# Patient Record
Sex: Female | Born: 1965 | Race: Black or African American | Hispanic: No | Marital: Single
Health system: Southern US, Community
[De-identification: ages and names within clinical notes are randomized; demographics above are authoritative.]

---

## 2013-03-24 ENCOUNTER — Emergency Department: Payer: Self-pay | Admitting: Emergency Medicine

## 2013-03-24 LAB — URINALYSIS, COMPLETE
Blood: NEGATIVE
Leukocyte Esterase: NEGATIVE
Ph: 7 (ref 4.5–8.0)
RBC,UR: 2 /HPF (ref 0–5)
Specific Gravity: 1.015 (ref 1.003–1.030)
Squamous Epithelial: 3
WBC UR: 5 /HPF (ref 0–5)

## 2013-03-24 LAB — CBC
HCT: 41.4 % (ref 35.0–47.0)
HGB: 14.1 g/dL (ref 12.0–16.0)
MCH: 27.7 pg (ref 26.0–34.0)
MCHC: 34.1 g/dL (ref 32.0–36.0)
MCV: 81 fL (ref 80–100)
Platelet: 308 10*3/uL (ref 150–440)
RBC: 5.11 10*6/uL (ref 3.80–5.20)
RDW: 18.5 % — ABNORMAL HIGH (ref 11.5–14.5)
WBC: 11.5 10*3/uL — ABNORMAL HIGH (ref 3.6–11.0)

## 2013-03-24 LAB — COMPREHENSIVE METABOLIC PANEL
Albumin: 4.2 g/dL (ref 3.4–5.0)
Anion Gap: 8 (ref 7–16)
BUN: 8 mg/dL (ref 7–18)
Calcium, Total: 9.7 mg/dL (ref 8.5–10.1)
Chloride: 99 mmol/L (ref 98–107)
Creatinine: 0.77 mg/dL (ref 0.60–1.30)
EGFR (African American): >60
Glucose: 115 mg/dL — ABNORMAL HIGH (ref 65–99)
Potassium: 3.2 mmol/L — ABNORMAL LOW (ref 3.5–5.1)
SGOT(AST): 20 U/L (ref 15–37)
SGPT (ALT): 19 U/L (ref 12–78)
Sodium: 138 mmol/L (ref 136–145)
Total Protein: 8.7 g/dL — ABNORMAL HIGH (ref 6.4–8.2)

## 2013-03-24 LAB — LIPASE, BLOOD: Lipase: 374 U/L (ref 73–393)

## 2013-03-25 LAB — PREGNANCY, URINE: Pregnancy Test, Urine: NEGATIVE m[IU]/mL

## 2013-06-05 ENCOUNTER — Emergency Department: Payer: Self-pay | Admitting: Emergency Medicine

## 2013-06-05 LAB — COMPREHENSIVE METABOLIC PANEL
Albumin: 4 g/dL (ref 3.4–5.0)
Bilirubin,Total: 0.6 mg/dL (ref 0.2–1.0)
Calcium, Total: 9.2 mg/dL (ref 8.5–10.1)
Co2: 28 mmol/L (ref 21–32)
EGFR (African American): >60
EGFR (Non-African Amer.): >60
Glucose: 115 mg/dL — ABNORMAL HIGH (ref 65–99)
Osmolality: 276 (ref 275–301)
SGPT (ALT): 20 U/L (ref 12–78)
Sodium: 139 mmol/L (ref 136–145)

## 2013-06-05 LAB — URINALYSIS, COMPLETE
Bilirubin,UR: NEGATIVE
Blood: NEGATIVE
Leukocyte Esterase: NEGATIVE
Nitrite: NEGATIVE
Ph: 8 (ref 4.5–8.0)
Protein: 100
Specific Gravity: 1.014 (ref 1.003–1.030)
WBC UR: 14 /HPF (ref 0–5)

## 2013-06-05 LAB — CBC
HGB: 11.4 g/dL — ABNORMAL LOW (ref 12.0–16.0)
MCH: 27.6 pg (ref 26.0–34.0)
MCHC: 34.2 g/dL (ref 32.0–36.0)
MCV: 81 fL (ref 80–100)
RBC: 4.13 10*6/uL (ref 3.80–5.20)
RDW: 17.5 % — ABNORMAL HIGH (ref 11.5–14.5)

## 2013-06-05 LAB — LIPASE, BLOOD: Lipase: 292 U/L (ref 73–393)

## 2013-06-06 LAB — PREGNANCY, URINE: Pregnancy Test, Urine: NEGATIVE m[IU]/mL

## 2013-06-07 ENCOUNTER — Emergency Department: Payer: Self-pay | Admitting: Emergency Medicine

## 2013-06-07 LAB — CBC
HCT: 37.2 % (ref 35.0–47.0)
MCH: 26.9 pg (ref 26.0–34.0)
MCHC: 33 g/dL (ref 32.0–36.0)
MCV: 81 fL (ref 80–100)
Platelet: 310 10*3/uL (ref 150–440)
RBC: 4.57 10*6/uL (ref 3.80–5.20)
RDW: 17.3 % — ABNORMAL HIGH (ref 11.5–14.5)
WBC: 11.9 10*3/uL — ABNORMAL HIGH (ref 3.6–11.0)

## 2013-06-07 LAB — DRUG SCREEN, URINE
Amphetamines, Ur Screen: NEGATIVE (ref ?–1000)
Barbiturates, Ur Screen: NEGATIVE (ref ?–200)
Benzodiazepine, Ur Scrn: NEGATIVE (ref ?–200)
Cocaine Metabolite,Ur ~~LOC~~: NEGATIVE (ref ?–300)
MDMA (Ecstasy)Ur Screen: NEGATIVE (ref ?–500)
Opiate, Ur Screen: POSITIVE (ref ?–300)
Tricyclic, Ur Screen: NEGATIVE (ref ?–1000)

## 2013-06-07 LAB — URINALYSIS, COMPLETE
Bilirubin,UR: NEGATIVE
Glucose,UR: NEGATIVE mg/dL (ref 0–75)
Nitrite: NEGATIVE
Ph: 7 (ref 4.5–8.0)
RBC,UR: 24 /HPF (ref 0–5)
Specific Gravity: 1.014 (ref 1.003–1.030)
WBC UR: 19 /HPF (ref 0–5)

## 2013-06-07 LAB — COMPREHENSIVE METABOLIC PANEL
Albumin: 4 g/dL (ref 3.4–5.0)
Anion Gap: 4 — ABNORMAL LOW (ref 7–16)
Bilirubin,Total: 0.6 mg/dL (ref 0.2–1.0)
Chloride: 102 mmol/L (ref 98–107)
Creatinine: 0.77 mg/dL (ref 0.60–1.30)
EGFR (African American): >60
Osmolality: 276 (ref 275–301)
Potassium: 3 mmol/L — ABNORMAL LOW (ref 3.5–5.1)
SGOT(AST): 23 U/L (ref 15–37)
SGPT (ALT): 21 U/L (ref 12–78)
Sodium: 138 mmol/L (ref 136–145)
Total Protein: 7.8 g/dL (ref 6.4–8.2)

## 2013-06-07 LAB — LIPASE, BLOOD: Lipase: 118 U/L (ref 73–393)

## 2013-06-09 LAB — URINE CULTURE

## 2013-07-14 LAB — COMPREHENSIVE METABOLIC PANEL
Albumin: 3.1 g/dL — ABNORMAL LOW (ref 3.4–5.0)
Alkaline Phosphatase: 89 U/L
Anion Gap: 10 (ref 7–16)
BUN: 10 mg/dL (ref 7–18)
Calcium, Total: 8.9 mg/dL (ref 8.5–10.1)
Chloride: 103 mmol/L (ref 98–107)
Creatinine: 0.88 mg/dL (ref 0.60–1.30)
EGFR (Non-African Amer.): >60
Osmolality: 271 (ref 275–301)
Potassium: 2.6 mmol/L — ABNORMAL LOW (ref 3.5–5.1)
SGPT (ALT): 13 U/L (ref 12–78)
Sodium: 135 mmol/L — ABNORMAL LOW (ref 136–145)

## 2013-07-14 LAB — CBC WITH DIFFERENTIAL/PLATELET
Basophil #: 0 10*3/uL (ref 0.0–0.1)
Eosinophil #: 0 10*3/uL (ref 0.0–0.7)
Eosinophil %: 0 %
HCT: 34.1 % — ABNORMAL LOW (ref 35.0–47.0)
Lymphocyte %: 2 %
MCV: 77 fL — ABNORMAL LOW (ref 80–100)
Monocyte #: 2.4 x10 3/mm — ABNORMAL HIGH (ref 0.2–0.9)
Monocyte %: 11.1 %
Neutrophil #: 19.1 10*3/uL — ABNORMAL HIGH (ref 1.4–6.5)
Neutrophil %: 86.8 %
Platelet: 261 10*3/uL (ref 150–440)
RBC: 4.4 10*6/uL (ref 3.80–5.20)
WBC: 22 10*3/uL — ABNORMAL HIGH (ref 3.6–11.0)

## 2013-07-14 LAB — LIPASE, BLOOD: Lipase: 38 U/L — ABNORMAL LOW (ref 73–393)

## 2013-07-15 ENCOUNTER — Inpatient Hospital Stay: Payer: Self-pay | Admitting: Internal Medicine

## 2013-07-15 LAB — URINALYSIS, COMPLETE
Bacteria: NONE SEEN
Nitrite: NEGATIVE
Protein: 100
WBC UR: 251 /HPF (ref 0–5)

## 2013-07-16 LAB — DRUG SCREEN, URINE
Amphetamines, Ur Screen: NEGATIVE (ref ?–1000)
Barbiturates, Ur Screen: NEGATIVE (ref ?–200)
Benzodiazepine, Ur Scrn: NEGATIVE (ref ?–200)
MDMA (Ecstasy)Ur Screen: NEGATIVE (ref ?–500)
Methadone, Ur Screen: NEGATIVE (ref ?–300)
Phencyclidine (PCP) Ur S: NEGATIVE (ref ?–25)

## 2013-07-16 LAB — CBC WITH DIFFERENTIAL/PLATELET
Basophil #: 0 10*3/uL (ref 0.0–0.1)
Eosinophil #: 0.1 10*3/uL (ref 0.0–0.7)
Eosinophil %: 0.6 %
HCT: 30.1 % — ABNORMAL LOW (ref 35.0–47.0)
HGB: 10 g/dL — ABNORMAL LOW (ref 12.0–16.0)
Lymphocyte #: 1.1 10*3/uL (ref 1.0–3.6)
MCHC: 33.2 g/dL (ref 32.0–36.0)
MCV: 77 fL — ABNORMAL LOW (ref 80–100)
Neutrophil #: 6.6 10*3/uL — ABNORMAL HIGH (ref 1.4–6.5)
Platelet: 262 10*3/uL (ref 150–440)
RBC: 3.89 10*6/uL (ref 3.80–5.20)
RDW: 19 % — ABNORMAL HIGH (ref 11.5–14.5)

## 2013-07-16 LAB — IRON AND TIBC
Iron Bind.Cap.(Total): 285 ug/dL (ref 250–450)
Iron Saturation: 8 %
Unbound Iron-Bind.Cap.: 263 ug/dL

## 2013-07-16 LAB — BASIC METABOLIC PANEL
BUN: 4 mg/dL — ABNORMAL LOW (ref 7–18)
Chloride: 105 mmol/L (ref 98–107)
Co2: 24 mmol/L (ref 21–32)
EGFR (Non-African Amer.): >60
Glucose: 105 mg/dL — ABNORMAL HIGH (ref 65–99)
Potassium: 3 mmol/L — ABNORMAL LOW (ref 3.5–5.1)

## 2013-07-16 LAB — FERRITIN: Ferritin (ARMC): 71 ng/mL (ref 8–388)

## 2013-07-17 LAB — URINALYSIS, COMPLETE
Bacteria: NONE SEEN
Bilirubin,UR: NEGATIVE
Glucose,UR: NEGATIVE mg/dL (ref 0–75)
Ph: 7 (ref 4.5–8.0)
Protein: NEGATIVE
RBC,UR: 19 /HPF (ref 0–5)
Specific Gravity: 1.008 (ref 1.003–1.030)
WBC UR: 34 /HPF (ref 0–5)

## 2013-07-18 LAB — BASIC METABOLIC PANEL
Anion Gap: 8 (ref 7–16)
BUN: 3 mg/dL — ABNORMAL LOW (ref 7–18)
Calcium, Total: 9.1 mg/dL (ref 8.5–10.1)
Chloride: 106 mmol/L (ref 98–107)
EGFR (Non-African Amer.): >60
Glucose: 100 mg/dL — ABNORMAL HIGH (ref 65–99)
Osmolality: 269 (ref 275–301)
Sodium: 136 mmol/L (ref 136–145)

## 2013-07-18 LAB — CBC WITH DIFFERENTIAL/PLATELET
Basophil #: 0.1 10*3/uL (ref 0.0–0.1)
Eosinophil %: 1.1 %
HCT: 33.6 % — ABNORMAL LOW (ref 35.0–47.0)
HGB: 11 g/dL — ABNORMAL LOW (ref 12.0–16.0)
Lymphocyte %: 19.8 %
MCH: 25.1 pg — ABNORMAL LOW (ref 26.0–34.0)
MCHC: 32.7 g/dL (ref 32.0–36.0)
Monocyte #: 1.9 x10 3/mm — ABNORMAL HIGH (ref 0.2–0.9)
Platelet: 338 10*3/uL (ref 150–440)
WBC: 12.2 10*3/uL — ABNORMAL HIGH (ref 3.6–11.0)

## 2013-07-19 LAB — URINE CULTURE

## 2013-07-21 LAB — CULTURE, BLOOD (SINGLE)

## 2013-07-27 ENCOUNTER — Ambulatory Visit: Payer: Self-pay | Admitting: Gastroenterology

## 2013-07-27 ENCOUNTER — Emergency Department: Payer: Self-pay | Admitting: Emergency Medicine

## 2013-07-27 LAB — CBC WITH DIFFERENTIAL/PLATELET
Basophil #: 0.1 10*3/uL (ref 0.0–0.1)
Basophil %: 1 %
Eosinophil #: 0 10*3/uL (ref 0.0–0.7)
HCT: 37.1 % (ref 35.0–47.0)
HGB: 12.1 g/dL (ref 12.0–16.0)
Lymphocyte #: 1.1 10*3/uL (ref 1.0–3.6)
MCH: 25 pg — ABNORMAL LOW (ref 26.0–34.0)
MCV: 76 fL — ABNORMAL LOW (ref 80–100)
Monocyte #: 0.7 x10 3/mm (ref 0.2–0.9)
Neutrophil %: 80.2 %
RBC: 4.86 10*6/uL (ref 3.80–5.20)

## 2013-07-27 LAB — COMPREHENSIVE METABOLIC PANEL
Alkaline Phosphatase: 79 U/L
Bilirubin,Total: 0.7 mg/dL (ref 0.2–1.0)
Calcium, Total: 9.9 mg/dL (ref 8.5–10.1)
Co2: 27 mmol/L (ref 21–32)
Creatinine: 0.83 mg/dL (ref 0.60–1.30)
EGFR (African American): >60
Glucose: 117 mg/dL — ABNORMAL HIGH (ref 65–99)
Osmolality: 269 (ref 275–301)
SGOT(AST): 25 U/L (ref 15–37)
SGPT (ALT): 20 U/L (ref 12–78)
Sodium: 135 mmol/L — ABNORMAL LOW (ref 136–145)
Total Protein: 8.7 g/dL — ABNORMAL HIGH (ref 6.4–8.2)

## 2013-07-27 LAB — URINALYSIS, COMPLETE
Bilirubin,UR: NEGATIVE
Glucose,UR: NEGATIVE mg/dL (ref 0–75)
Hyaline Cast: 7
Leukocyte Esterase: NEGATIVE
Nitrite: NEGATIVE
Ph: 5 (ref 4.5–8.0)
Specific Gravity: 1.014 (ref 1.003–1.030)

## 2013-12-08 IMAGING — CT CT ABD-PELV W/ CM
2 of 5 series · 16 of 46 positions shown, 18 images · IV contrast (isovue)
Comparison: 06/06/2013

CLINICAL DATA: Generalized abdominal pain with right CVA tenderness
and possible pyelonephritis.

EXAM:
CT ABDOMEN AND PELVIS WITH CONTRAST
TECHNIQUE: Multidetector CT imaging of the abdomen and pelvis was performed
using the standard protocol following bolus administration of
intravenous contrast.
CONTRAST:  100 cc Isovue 300 IV

[Series 2: routine abd pel with · axial · 0.68mm/px · z∈[-466,-42]mm · 13 of 95 slices shown, 15 images]
[im 5/95  soft-tissue]
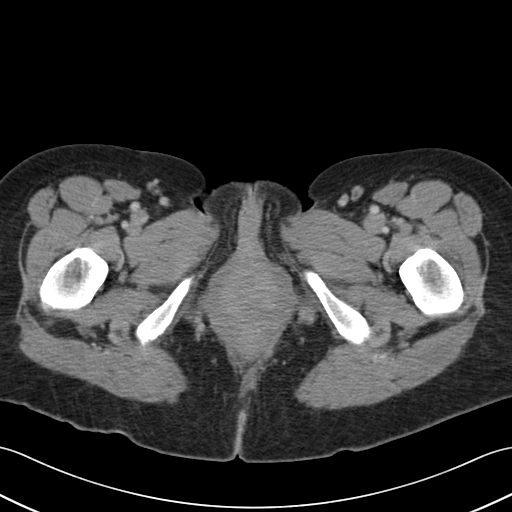
[im 5/95  bone]
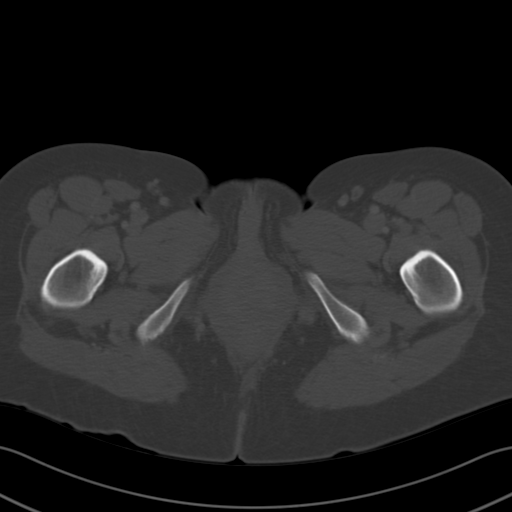
[im 15/95  soft-tissue]
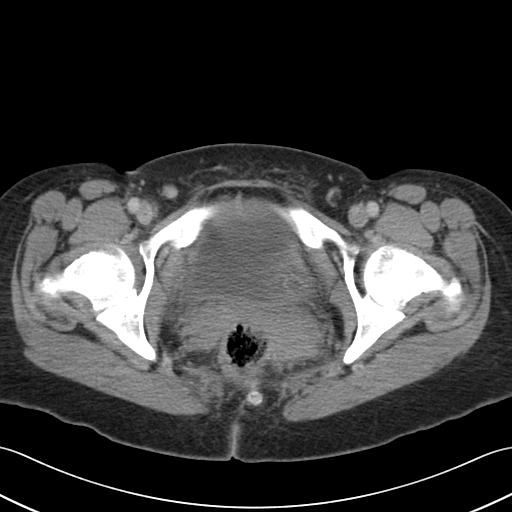
[im 20/95  soft-tissue]
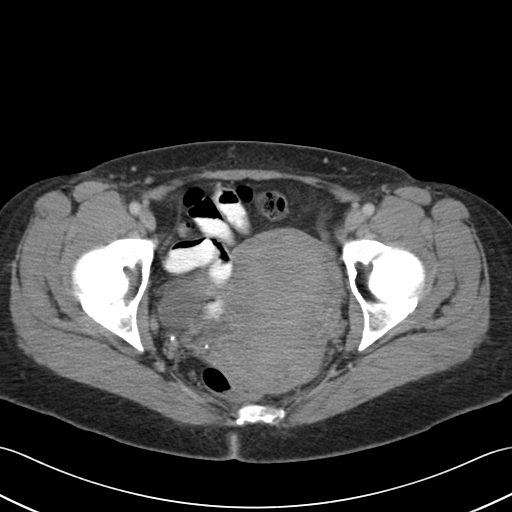
[im 25/95  soft-tissue]
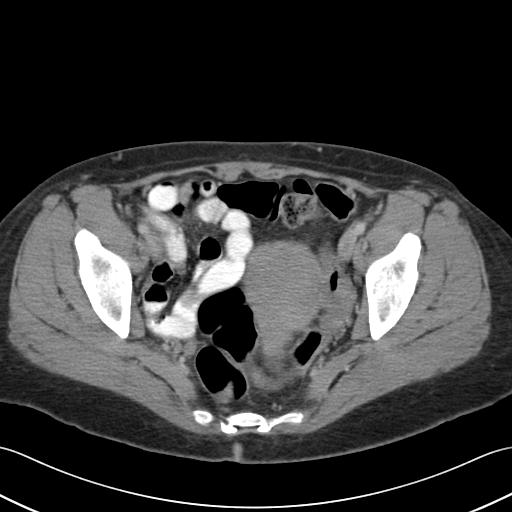
[im 35/95  soft-tissue]
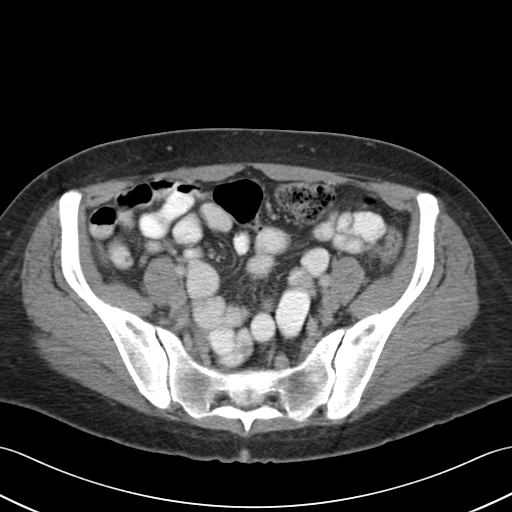
[im 40/95  soft-tissue]
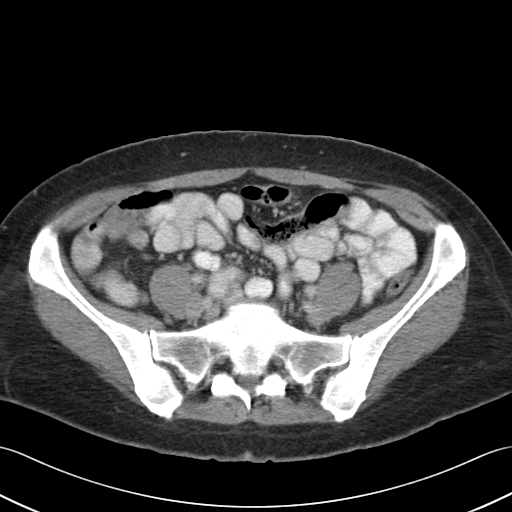
[im 50/95  soft-tissue]
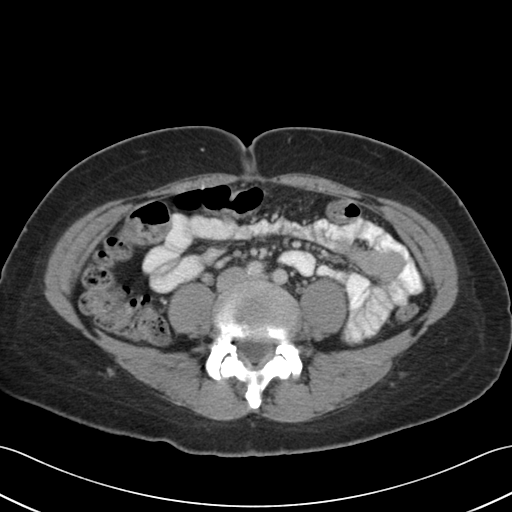
[im 55/95  soft-tissue]
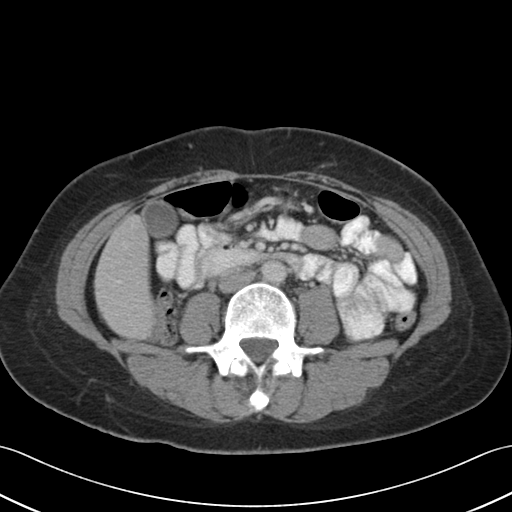
[im 60/95  soft-tissue]
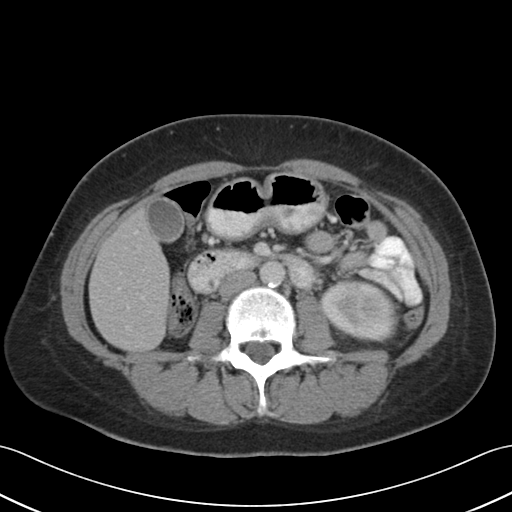
[im 60/95  bone]
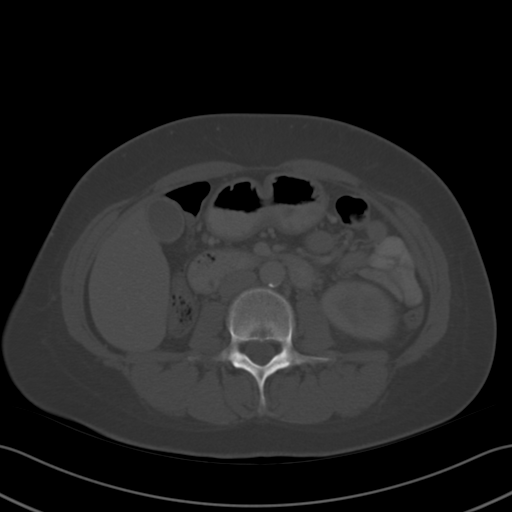
[im 70/95  soft-tissue]
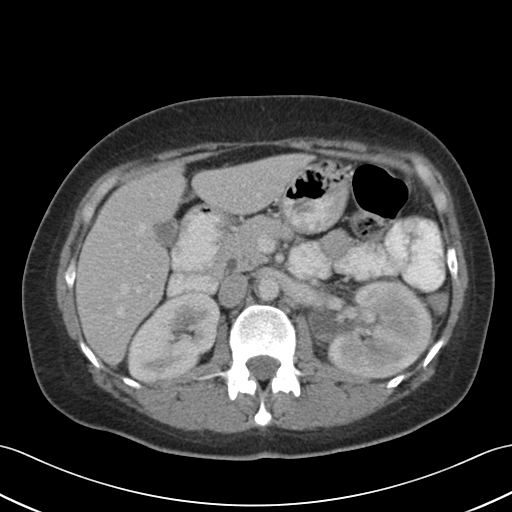
[im 75/95  soft-tissue]
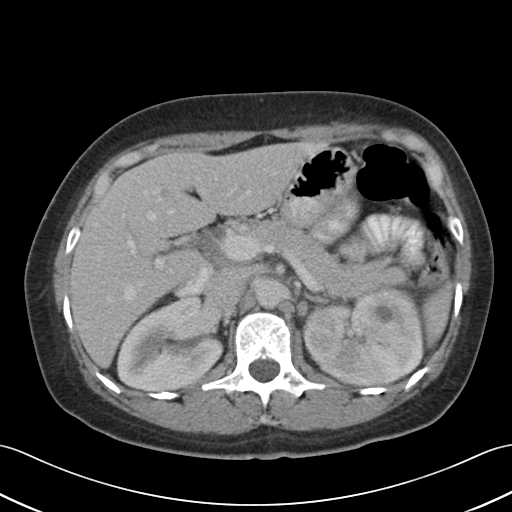
[im 80/95  soft-tissue]
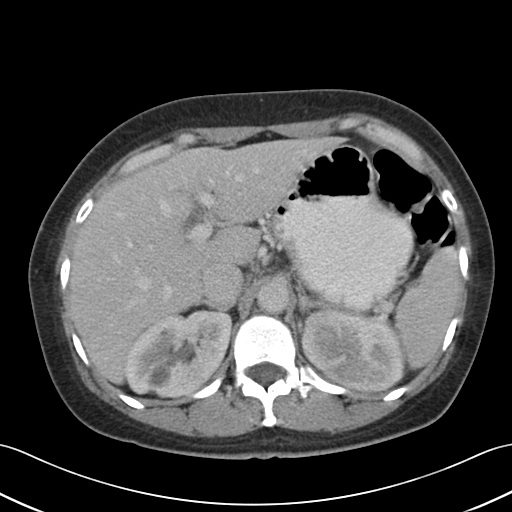
[im 90/95  soft-tissue]
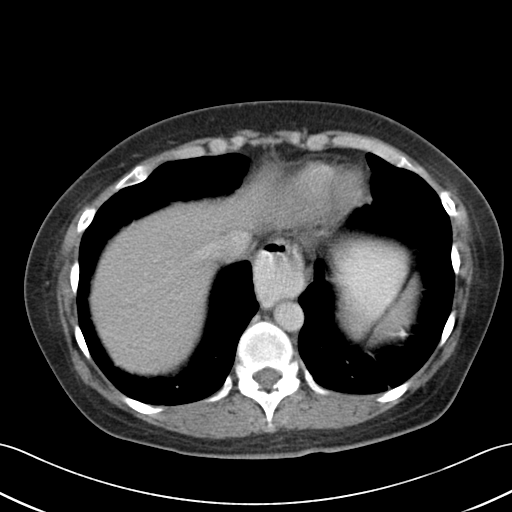

[Series 6: cor routine abd pel with · coronal · 0.68mm/px · 3 of 120 slices shown]
[im 40/120  soft-tissue]
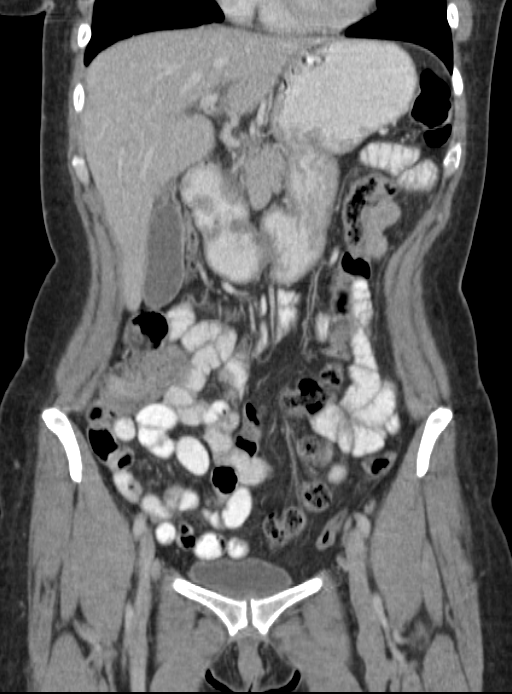
[im 53/120  soft-tissue]
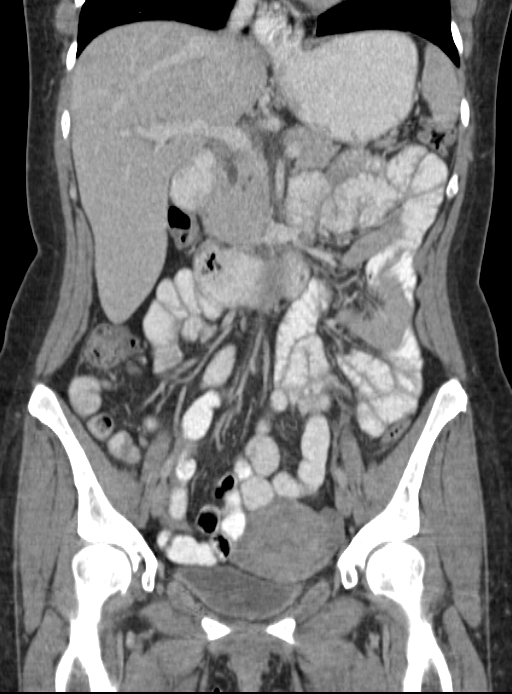
[im 67/120  soft-tissue]
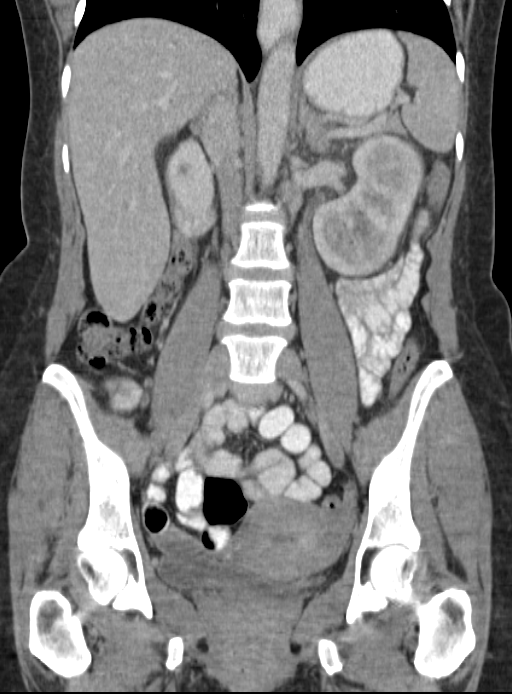

[16 of 46 positions shown; findings below may reference images not displayed]

FINDINGS: BODY WALL: Unremarkable.

LOWER CHEST: Small hiatal hernia with poor clearance or
gastroesophageal reflux.

ABDOMEN/PELVIS:

Liver: No focal abnormality.

Biliary: No evidence of biliary obstruction or stone.

Pancreas: Unremarkable.

Spleen: Unremarkable.

Adrenals: Unremarkable.

Kidneys and ureters: Enlarged kidneys with patchy enhancement, left
worse than right. The left urothelium is thickened and hyper
enhancing throughout. Bilateral low dense renal lesions, at least 2
on the right and 5 on the left. The 2 right lesions were present
previously, consistent with cysts, measuring up to 15 mm. The
largest on the left was also present previously, 1 cm diameter. No
urinary obstruction.

Bladder: Unremarkable.

Reproductive: Unremarkable.

Bowel: No obstruction. Normal appendix.

Retroperitoneum: No mass or adenopathy.

Peritoneum: No free fluid or gas.

Vascular: No acute abnormality.

OSSEOUS: No acute abnormalities.
IMPRESSION: 1. Bilateral pyelonephritis, left worse than right.
2. No hydronephrosis.
3. Small (up to 8mm) cystic structures in the left kidney may have
been pre-existing cysts, microabscess not excluded. If there is poor
clinical response to antibiotics, recommend repeat imaging.

## 2014-12-13 NOTE — Consult Note (Signed)
Brief Consult Note: Diagnosis: chronic nausea & vomiting.   Patient was seen by consultant.   Consult note dictated.   Comments: Jessica Ford is a pleasant 49 y/o female admitted with acute pyelonephritis responding to antibiotic therapy.  She has chronic nausea & vomiting on an almost daily basis.  She also has chronic constipation & intermittent hematochezia.  She needs an EGD & colonoscopy with Dr Servando SnareWohl.  Differentials include chronic gastritis, poorly controlled GERD, PUD, or gastroparesis.  Cyclic cannabinoid vomiting syndrome remains in differential as well.  As far as her hematochzia, likely due to chronic constipation, straining & benign anorectal bleeding, but cannot r/o inflammatory bowel disease or malignancy without colonoscopy. She has a mild iron deficiency anemia.  Given that she has had applesauce at 10am & breakfast today, she cannot have endoscopy today.  I have discussed with Dr Servando SnareWohl & we can arrange EGD & colonsocopy as an outpatient as long as she is stable to go home.  Plan: 1) PPI daily 2) Continue support measures including zofran PRN 3) We will arrange outpatient colonoscopy & EGD next week & fax instructions to the floor 4) FU ABD films 5) Begin miralax purge for constipation & dulcolax 6) Suprep for colonoscopy prep was sent to [facility]  Will sign off.  Thanks for consult.  Please call if any questions or concerns.  Please see full dictated note. #[id number].  Electronic Signatures: Joselyn ArrowJones, Cynitha Berte L (NP)  (Signed [phone]-Nov-14 11:56)  Authored: Brief Consult Note   Last Updated: 26-Nov-14 11:56 by Joselyn ArrowJones, Holley Kocurek L (NP)

## 2014-12-13 NOTE — Discharge Summary (Signed)
PATIENT NAME:  Jessica Ford, Jessica Ford MR#:  [id number] DATE OF BIRTH:  06/08/1966  DATE OF ADMISSION:  07/15/2013 DATE OF DISCHARGE:  07/19/2013  DISCHARGE DIAGNOSES: 1.  Bilateral pyelonephritis.  2.  Sepsis.  3.  Hypokalemia.  4.  Microcytic anemia.  5.  Constipation.  6.  Nausea and vomiting.    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, nurse practitioner.   IMAGING STUDIES: Include a CT scan of the abdomen and pelvis with contrast, which showed bilateral pyelonephritis, no obstruction. Small cysts.   Abdomen, flat and erect, showed normal bowel gas pattern.   ADMITTING HISTORY AND PHYSICAL: This is a 49 year old African American female patient with no significant past medical history, who entered the hospital with dysuria, hematuria, pelvic pain, which had been progressively worsening. The patient was found to have pyelonephritis on CT scan, admitted to the hospitalist service with sepsis.   HOSPITAL COURSE: 1.  Bilateral pyelonephritis with sepsis. The patient was started on IV antibiotics, IV fluids, with which her pain slowly improved. Cultures have been negative to date. She had about 30,000 yeast in the urine, which was likely contaminant. The patient is not diabetic, not  immunocompromised, does not have a Foley catheter. Unfortunately, blood cultures, urine cultures were not sent from the Emergency Room, and cultures were drawn after antibiotics were given. The patient will be on antibiotics to finish a course of a total of 14 days.  2.  Abdominal pain, nausea, vomiting. The patient has had nausea, vomiting, abdominal pain on and off for three weeks prior to admission. For GI, Dr. Servando SnareWohl was consulted. They saw the patient and scheduled her for an EGD, colonoscopy on 07/27/2013.  3.  Hypertension. New diagnosis of hypertension the hospital for which the patient has been started on lisinopril. This will be continued at home.   Prior to discharge, the patient has minimal tenderness on abdominal  examination. No rigidity or guarding. Bowel sounds present, doing significantly better than on admission, and will be discharged home after being afebrile, normal white count.   DISCHARGE MEDICATIONS: Include:  1.  Acetaminophen oxycodone 325/5, 1 tablet every six hours as needed for pain.  2.  Cefuroxime 500 mg oral 2 times a day.  3.  Lisinopril 20 mg oral once a day.  4.  MiraLax 17 grams oral once a day as needed.  5.  Promethazine 25 mg oral 3 times a day as needed for nausea or vomiting.    DISCHARGE INSTRUCTIONS: Low-sodium diet. Activity as tolerated. Follow up with primary care physician in 1 to 2 weeks and Dr. Servando SnareWohl on 07/27/2013 for EGD and colonoscopy.   Time spent on day of discharge in discharge activity was 40 minutes.    ____________________________ Molinda BailiffSrikar R. Hyatt Capobianco, MD srs:cg D: 07/22/2013 23:36:19 ET T: 07/23/2013 00:21:04 ET JOB#: [id number]  cc: Wardell HeathSrikar R. Jordyan Hardiman, MD, <Dictator> Orie FishermanSRIKAR R Anairis Knick MD ELECTRONICALLY SIGNED 07/23/2013 1:44

## 2014-12-13 NOTE — Consult Note (Signed)
PATIENT NAME:  Jessica Ford, Jessica Ford MR#:  [id number] DATE OF BIRTH:  02-18-1966  DATE OF CONSULTATION:  07/18/2013  REFERRING PHYSICIAN: Dr. Darvin Neighbours  CONSULTING PHYSICIAN: Dr. Lucilla Lame.  PRIMARY CARE PHYSICIAN: The patient denies any.  REASON FOR CONSULTATION: Chronic nausea and vomiting and chronic constipation.   HISTORY OF PRESENT ILLNESS: Jessica Ford is a 49 year old black female, who was admitted to [facility] on 07/15/2013 with acute pyelonephritis. She has been on Rocephin for this and has improved significantly. We were to evaluate her for chronic nausea, vomiting and chronic constipation. She gives history of being diagnosed with "gastritis" in the ER several months ago. She gives a greater than 1-year history of intermittent nausea and vomiting almost on a daily basis. She says the symptoms have been worse over the last 2 months. Her symptoms are worse first thing in the morning and at night. She has nausea and vomiting a couple times almost every day. She does have heartburn and indigestion a couple days a week. She has not tried a PPI. She has tried over-the-counter TUMS and Rolaids, which seem to help some. She has chronic constipation. She can go up to 3 weeks without a bowel movement. She reports her last bowel movement was 2 weeks ago. She was given several doses of lactulose as well as Relistor here in the hospital and tells me she really has not had any response to those medications. She denies any abdominal pain at this time other than some lower abdominal cramps. She has been seeing some dark and bright red blood mixed in with her stools as well with wiping over the last several months. She thought this was due to hemorrhoids. She has never had a colonoscopy. Her appetite has been poor. She reports a 25 pound weight loss in the past year. Her dysuria has resolved.   She does smoke marijuana and this was positive on her urine drug screen done in the ER. She tells me that she was smoking because  she had nausea prior to coming into the hospital, but had not smoked any in the last 6 months. She denies any chronic alcohol use. Her last meal was at 10:00 a.m. where she had applesauce after breakfast with her potassium pills. she had a CT scan of abdomen and pelvis on November 23 with IV contrast only, which showed bilateral pyelonephritis, left worse than right and a small up to 8 mm cystic structure in the left kidney. She denies any NSAIDs.  PAST MEDICAL AND SURGICAL HISTORY: GERD/gastritis, chronic constipation and pyelonephritis.   MEDICATIONS PRIOR TO ADMISSION: Cipro 500 mg b.i.d.   ALLERGIES: No known drug allergies.   FAMILY HISTORY: Positive for a father, who had cancer of unknown etiology, but the patient believes it was "throughout his GI tract." Otherwise unremarkable.   SOCIAL HISTORY: She is single. She has 4 children, 2 of whom are grown and she lives with her boyfriend and 48 and 58 year old. 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 She has a 30 pack-year history of tobacco use. Occasionally smokes marijuana. Denies any other illicit drug use and denies any alcohol use.  REVIEW OF SYSTEMS: See HPI, otherwise, negative complete 10-point review of systems.   PHYSICAL EXAMINATION:  VITAL SIGNS: Temperature 98.4, pulse 77, respirations 20, blood pressure 149/82, oxygen saturation 99% on room air.  GENERAL: She is a well-developed, well-nourished black female who is alert, oriented, pleasant and cooperative, in no acute distress.  HEENT: Sclerae clear, anicteric. Conjunctivae pink. Oropharynx pink and moist without  any lesions.  NECK: Supple without mass or thyromegaly.  CHEST: Heart regular rhythm. Normal S1, S2. No murmurs, clicks, rubs or gallops.  LUNGS: Clear to auscultation bilaterally.  ABDOMEN: Positive bowel sounds x 4. No bruits auscultated. Abdomen is soft, nondistended, nontender without palpable mass or hepatosplenomegaly. No rebound, tenderness or guarding.  EXTREMITIES:  Without clubbing or edema.  SKIN: Warm and dry without any rash or jaundice.  NEUROLOGIC: Grossly intact.  MUSCULOSKELETAL: Good equal movement and strength bilaterally.  PSYCHIATRIC: Alert, cooperative. Normal mood and affect.   LABORATORY STUDIES: Urine pregnancy test was negative. Urinalysis 1+ blood, 2+ LE, 19 RBCs and 34 bacteria and 10 epithelial cells. Blood culture negative. Hemoglobin 11, hematocrit 33.6, white blood cell count of 12.2. Iron 22, glucose 100, BUN 3, creatinine 0.57, otherwise normal MET-7. IBC 285, UIBC 263, iron sat 8%, lipase 38, ferritin 71.   IMPRESSION: Jessica Ford is a 49 year old black female admitted with acute pyelonephritis responding to antibiotic therapy. She has chronic nausea and vomiting on an almost daily basis. She also has chronic constipation and intermittent hematochezia. She needs and EGD and colonoscopy with Dr. Allen Norris. Differentials include chronic gastritis, poorly controlled GERD, peptic ulcer disease or gastroparesis. Cyclic cannabinoid vomiting syndrome remains in the differential as well. As far as her hematochezia, it is likely due to chronic constipation, straining and benign rectal bleeding, but cannot rule out inflammatory bowel disease or malignancy without colonoscopy, especially given the fact that she has a mild iron deficiency anemia. Due to the fact that has had applesauce at 10:00 this morning and breakfast earlier, she cannot have endoscopy today unfortunately. I have discussed with Dr. Allen Norris and we arranged an outpatient EGD and colonoscopy as long as she is able and stable enough to go home.   PLAN:  1.  Add PPI daily.  2.  Continue supportive measures including Zofran p.r.n.  3.  We will arrange outpatient colonoscopy and EGD next week and fax her instructions to the floor. I have called her prep to [facility] in [place].  4.  Followup abdominal films today.  5.  Begin MiraLax perch for constipation. She will be given 10 mg of Dulcolax now  followed by 5 tsp of MiraLax in 4 ounces of Gatorade every hour until clear.  Thank you for allowing [place] to participate in the care of Jessica Ford.  ____________________________ Andria Meuse, NP klj:aw D: 07/18/2013 11:56:21 ET T: 07/18/2013 12:11:06 ET JOB#: [id number]  cc: Andria Meuse, NP, <Dictator> Andria Meuse FNP ELECTRONICALLY SIGNED 07/30/2013 15:33

## 2014-12-13 NOTE — H&P (Signed)
PATIENT NAME:  Jessica Ford, Jessica Ford MR#:  [id number] DATE OF BIRTH:  [birth year]  DATE OF ADMISSION:  07/15/2013  REFERRING PHYSICIAN: Dr. Manson Passey.   PRIMARY CARE PHYSICIAN: Nonlocal.  CHIEF COMPLAINT: The patient states "my coochie hurts."   HISTORY OF PRESENT ILLNESS: This 49 year old African American female, without significant past medical history, presenting with dysuria, hematuria, pelvic pain, states she has had progressively worsening over a two-week duration symptoms. She is now having frank hematuria with associated dysuria and increased urinary frequency, as well as associated abdominal pain and pelvic pain which is located suprapubically, with radiation to the flanks bilaterally, 7 to 8 out of 10 in intensity, sharp and burning. No worsening or relieving factors. No associated fever and chills. States that she has been having symptoms for some time, though once again progressively worsening. Currently her pain, has reduced after receiving IV medication including morphine and Dilaudid.  REVIEW OF SYSTEMS:  CONSTITUTIONAL: Denies fever, fatigue, weakness.  EYES: Denies blurred vision, double vision, eye pain.  EARS, NOSE, THROAT: Denies tinnitus, ear pain, hearing loss.  RESPIRATORY: Denies cough, wheeze, shortness of breath.  CARDIOVASCULAR: Denies chest pain, palpitations, edema.  GASTROINTESTINAL: Denies nausea, vomiting, diarrhea.  GENITOURINARY: Positive for dysuria, hematuria, increased frequency.  ENDOCRINE: Denies nocturia or polyuria.  HEMATOLOGIC AND LYMPHATIC: Denies easy bruising or bleeding.  SKIN: Denies rash or lesion.  MUSCULOSKELETAL: Denies pain in neck, back, shoulder, knee or hip, any arthritic symptoms.  NEUROLOGIC: Denies paralysis, paresthesias. PSYCHIATRIC:  Denies anxiety or depressive symptoms.  Otherwise, full review of systems performed by me is negative.   PAST MEDICAL HISTORY: None.   SOCIAL HISTORY: Occasional alcohol, tobacco usage. She states has not done  either for about 3 to 4 weeks.   FAMILY HISTORY: Positive for hypertension and diabetes in multiple family members.   ALLERGIES: NO KNOWN ALLERGIES.   HOME MEDICATIONS: None.   PHYSICAL EXAMINATION: VITAL SIGNS: Temperature 99.3, heart rate 113, respirations 24, blood pressure 126/79, saturating 98% on room air. Weight 81.6 kg, BMI 27.4.  GENERAL: Well-nourished, well-developed, African American female, who is in minimal distress secondary to abdominal/pelvic pain.  HEAD: Normocephalic, atraumatic.  EYES: Pupils equal, round and affect. Extraocular muscles intact. No scleral icterus.  MOUTH: Moist mucosal membranes. Dentition intact. No abscess noted.  EARS, NOSE, AND THROAT: Throat clear without exudates. No external lesions.  NECK: Supple. No thyromegaly. No nodules. No JVD.  PULMONARY: Clear to auscultation bilaterally. No wheezes, rales, or rhonchi. No use of accessory muscles. Good respiratory effort. Nontender to palpation.  CARDIOVASCULAR: S1 and S2. Tachycardic. No murmurs, rubs, or gallops. No edema. Pedal pulses 2+ bilaterally.  GASTROINTESTINAL: Soft, nondistended. No masses. No hepatosplenomegaly. Positive bowel sounds. Tenderness to palpation over suprapubic region without rebound, tenderness or guarding. CVA tenderness positive.  MUSCULOSKELETAL: No swelling, clubbing or edema. Range of motion full in all extremities.  NEUROLOGIC: Cranial nerves II through XII intact. No gross functional deficits and reflexes intact. Sensation intact.  SKIN: No ulcerations, lesions, rashes or cyanosis. Skin warm and dry. Turgor is intact. The patient has multiple tattoos.  PSYCHIATRIC: Mood and affect within normal limits. The patient is awake, alert, oriented x3. Insight and judgment intact.   LABORATORY DATA: Sodium 135, potassium 2.6, chloride 103, bicarbonate 22, BUN 10, creatinine 0.88, glucose 134. LFTs: Albumin 3.1, otherwise within normal limits. WBC 22, hemoglobin 11.2, platelets 261,  MCV 77, RDW 19. Urinalysis: WBCs 251, RBCs 1236, leukocyte esterase positive, nitrite negative. Pregnancy test negative. CT abdomen was performed, revealing bilateral pyelonephritis,  left greater than right with multiple small renal cysts.   ASSESSMENT AND PLAN: A 49 year old African American female without significant past medical history, presenting with dysuria, hematuria.  1.  Sepsis, meeting sepsis criteria with tachycardia and leukocytosis secondary to urinary source and pyelonephritis. Urine cultures were obtained. Antibiotics with ceftriaxone. Follow culture data. Intravenous fluids, keep mean arterial pressure greater than 65.  2.  Hypokalemia. Replace to goal of 4 to 5. 3.  Anemia, which is microcytic. No indication for transfusion at this time.  4.  Hyponatremia. Intravenous fluid hydration with normal saline and follow sodium levels.  5.  Hyperglycemia, most likely stress-induced. She has no history of diabetes. We will follow glucose trend. If it remains elevated, we will need to perform Accu-Cheks and sliding scale insulin.  6.  Deep vein thrombosis prophylaxis with sequential compression devices only, given history of hematuria.   CODE STATUS: The patient is full code.   TIME SPENT: 45 minutes    ____________________________ Cletis Athensavid K. Hower, MD dkh:cg D: 07/15/2013 02:27:02 ET T: 07/15/2013 03:01:42 ET JOB#: [id number]  cc: Cletis Athensavid K. Hower, MD, <Dictator> DAVID Synetta ShadowK HOWER MD ELECTRONICALLY SIGNED 07/15/2013 3:24
